# Patient Record
Sex: Male | Born: 1960 | Race: White | Hispanic: No
Health system: Southern US, Community
[De-identification: ages and names within clinical notes are randomized; demographics above are authoritative.]

---

## 2017-01-06 ENCOUNTER — Emergency Department (HOSPITAL_COMMUNITY)
Admission: EM | Admit: 2017-01-06 | Discharge: 2017-01-07 | Disposition: A | Discharge status: Home / Self Care | Payer: Self-pay | Attending: Emergency Medicine | Admitting: Emergency Medicine

## 2017-01-06 DIAGNOSIS — Z79899 Other long term (current) drug therapy: Secondary | ICD-10-CM | POA: Insufficient documentation

## 2017-01-06 DIAGNOSIS — K921 Melena: Secondary | ICD-10-CM | POA: Insufficient documentation

## 2017-01-06 LAB — COMPREHENSIVE METABOLIC PANEL
ALBUMIN: 3.7 g/dL (ref 3.5–5.0)
ALK PHOS: 63 U/L (ref 38–126)
ALT: 15 U/L — ABNORMAL LOW (ref 17–63)
ANION GAP: 7 (ref 5–15)
AST: 17 U/L (ref 15–41)
BILIRUBIN TOTAL: 0.5 mg/dL (ref 0.3–1.2)
BUN: 9 mg/dL (ref 6–20)
CALCIUM: 9 mg/dL (ref 8.9–10.3)
CO2: 23 mmol/L (ref 22–32)
Chloride: 110 mmol/L (ref 101–111)
Creatinine, Ser: 1.04 mg/dL (ref 0.61–1.24)
GFR calc Af Amer: >60 mL/min (ref >60)
GFR calc non Af Amer: >60 mL/min (ref >60)
GLUCOSE: 91 mg/dL (ref 65–99)
Potassium: 3.8 mmol/L (ref 3.5–5.1)
Sodium: 140 mmol/L (ref 135–145)
TOTAL PROTEIN: 6.8 g/dL (ref 6.5–8.1)

## 2017-01-06 LAB — CBC WITH DIFFERENTIAL/PLATELET
Basophils Absolute: 0.1 10*3/uL (ref 0.0–0.1)
Basophils Relative: 1 %
Eosinophils Absolute: 0.1 10*3/uL (ref 0.0–0.7)
Eosinophils Relative: 2 %
HCT: 34.8 % — ABNORMAL LOW (ref 39.0–52.0)
Hemoglobin: 10.6 g/dL — ABNORMAL LOW (ref 13.0–17.0)
Lymphocytes Relative: 31 %
Lymphs Abs: 2.5 10*3/uL (ref 0.7–4.0)
MCH: 21.3 pg — ABNORMAL LOW (ref 26.0–34.0)
MCHC: 30.5 g/dL (ref 30.0–36.0)
MCV: 69.9 fL — ABNORMAL LOW (ref 78.0–100.0)
Monocytes Absolute: 0.5 10*3/uL (ref 0.1–1.0)
Monocytes Relative: 7 %
Neutro Abs: 4.8 10*3/uL (ref 1.7–7.7)
Neutrophils Relative %: 60 %
Platelets: 190 10*3/uL (ref 150–400)
RBC: 4.98 MIL/uL (ref 4.22–5.81)
RDW: 18.5 % — ABNORMAL HIGH (ref 11.5–15.5)
WBC: 8 10*3/uL (ref 4.0–10.5)

## 2017-01-06 LAB — TYPE AND SCREEN
ABO/RH(D): O POS
Antibody Screen: NEGATIVE

## 2017-01-06 LAB — ABO/RH: ABO/RH(D): O POS

## 2017-01-06 LAB — CBC
HEMATOCRIT: 35.9 % — AB (ref 39.0–52.0)
HEMOGLOBIN: 11 g/dL — AB (ref 13.0–17.0)
MCH: 21.1 pg — AB (ref 26.0–34.0)
MCHC: 30.6 g/dL (ref 30.0–36.0)
MCV: 68.9 fL — AB (ref 78.0–100.0)
Platelets: 200 10*3/uL (ref 150–400)
RBC: 5.21 MIL/uL (ref 4.22–5.81)
RDW: 18.2 % — ABNORMAL HIGH (ref 11.5–15.5)
WBC: 8.3 10*3/uL (ref 4.0–10.5)

## 2017-01-06 LAB — POC OCCULT BLOOD, ED: Fecal Occult Bld: POSITIVE — AB

## 2017-01-06 MED ORDER — PANTOPRAZOLE SODIUM 40 MG IV SOLR
40.0000 mg | Freq: Once | INTRAVENOUS | Status: AC
  Administered 2017-01-07: 40 mg via INTRAVENOUS
  Filled 2017-01-06: qty 40

## 2017-01-06 NOTE — ED Provider Notes (Signed)
[facility]-EMERGENCY DEPT Provider Note   CSN: [id number] Arrival date & time: 01/06/17  1723     History   Chief Complaint No chief complaint on file.   HPI Joel Turner is a 56 y.o. male.  The history is provided by the patient. No language interpreter was used.  Rectal Bleeding  Quality:  Black and tarry Amount:  Scant Duration:  1 day Timing:  Rare Chronicity:  Recurrent Context: not hemorrhoids and not rectal pain   Similar prior episodes: yes   Relieved by:  Nothing (Uses daily Nexium) Associated symptoms: abdominal pain and vomiting (x1)   Associated symptoms: no fever, no light-headedness and no loss of consciousness   Abdominal pain:    Location:  Epigastric   Severity:  Mild   Duration:  1 day   Progression:  Waxing and waning   Chronicity:  New Risk factors: no anticoagulant use, no hx of colorectal cancer, no hx of colorectal surgery, no NSAID use and no steroid use     History reviewed. No pertinent past medical history.  There are no active problems to display for this patient.   No past surgical history on file.    Home Medications    Prior to Admission medications   Medication Sig Start Date End Date Taking? Authorizing Provider  esomeprazole (NEXIUM) 40 MG capsule Take 40 mg by mouth daily at 12 noon.   Yes [provider]  ferrous sulfate 325 (65 FE) MG tablet Take 325 mg by mouth daily with breakfast.   Yes [provider]    Family History No family history on file.  Social History Social History  Substance Use Topics  . Smoking status: Not on file  . Smokeless tobacco: Not on file  . Alcohol use Not on file     Allergies   Patient has no known allergies.   Review of Systems Review of Systems  Constitutional: Negative for fever.  Gastrointestinal: Positive for abdominal pain, hematochezia and vomiting (x1).  Neurological: Negative for loss of consciousness and light-headedness.  Ten systems reviewed and are  negative for acute change, except as noted in the HPI.    Physical Exam Updated Vital Signs BP (!) 96/49   Pulse 76   Temp 97.6 F (36.4 C) (Oral)   Resp (!) 21   SpO2 93%   Physical Exam  Constitutional: He is oriented to person, place, and time. He appears well-developed and well-nourished. No distress.  Nontoxic appearing and in NAD  HENT:  Head: Normocephalic and atraumatic.  Moist mm  Eyes: Conjunctivae and EOM are normal. No scleral icterus.  Neck: Normal range of motion.  Cardiovascular: Normal rate, regular rhythm and intact distal pulses.   Pulmonary/Chest: Effort normal. No respiratory distress. He has no wheezes. He has no rales.  Lungs CTAB. Chest expansion symmetric.  Abdominal: Soft. He exhibits no mass. There is tenderness. There is no guarding.  TTP in the epigastric region. Negative Murphy's sign. Moderate distension without peritoneal signs. Normoactive bowel sounds.  Musculoskeletal: Normal range of motion.  Neurological: He is alert and oriented to person, place, and time. He exhibits normal muscle tone. Coordination normal.  GCS 15. Patient moving all extremities.  Skin: Skin is warm and dry. No rash noted. He is not diaphoretic. No erythema. No pallor.  Psychiatric: He has a normal mood and affect. His behavior is normal.  Nursing note and vitals reviewed.    ED Treatments / Results  Labs (all labs ordered are listed, but  only abnormal results are displayed) Labs Reviewed  CBC WITH DIFFERENTIAL/PLATELET - Abnormal; Notable for the following:       Result Value   Hemoglobin 10.6 (*)    HCT 34.8 (*)    MCV 69.9 (*)    MCH 21.3 (*)    RDW 18.5 (*)    All other components within normal limits  COMPREHENSIVE METABOLIC PANEL - Abnormal; Notable for the following:    ALT 15 (*)    All other components within normal limits  CBC - Abnormal; Notable for the following:    Hemoglobin 11.0 (*)    HCT 35.9 (*)    MCV 68.9 (*)    MCH 21.1 (*)    RDW 18.2  (*)    All other components within normal limits  POC OCCULT BLOOD, ED - Abnormal; Notable for the following:    Fecal Occult Bld POSITIVE (*)    All other components within normal limits  TYPE AND SCREEN  ABO/RH    EKG  EKG Interpretation None       Radiology Ct Abdomen Pelvis W Contrast  Result Date: 01/07/2017 CLINICAL DATA:  Acute onset of generalized abdominal pain and hematochezia. Initial encounter. EXAM: CT ABDOMEN AND PELVIS WITH CONTRAST TECHNIQUE: Multidetector CT imaging of the abdomen and pelvis was performed using the standard protocol following bolus administration of intravenous contrast. CONTRAST:  100 mL of Isovue 300 IV contrast COMPARISON:  None. FINDINGS: Lower chest: Trace left-sided pleural fluid is noted. Minimal left basilar scarring is noted. A large hiatal hernia is noted. Trace fluid is seen at the hiatal hernia. Hepatobiliary: The liver is unremarkable in appearance. The gallbladder is unremarkable in appearance. The common bile duct remains normal in caliber. Pancreas: The pancreas is within normal limits. Spleen: The spleen is unremarkable in appearance. Adrenals/Urinary Tract: The adrenal glands are unremarkable in appearance. The kidneys are within normal limits. There is no evidence of hydronephrosis. No renal or ureteral stones are identified. Nonspecific perinephric stranding is noted bilaterally. Stomach/Bowel: The stomach is unremarkable in appearance. The small bowel is within normal limits. The appendix is normal in caliber, without evidence of appendicitis. Scattered diverticulosis is noted along the descending and proximal sigmoid colon, without evidence of diverticulitis. Vascular/Lymphatic: Scattered calcification is seen along the abdominal aorta and its branches. The abdominal aorta is otherwise grossly unremarkable. The inferior vena cava is grossly unremarkable. No retroperitoneal lymphadenopathy is seen. No pelvic sidewall lymphadenopathy is  identified. Reproductive: The bladder is mildly distended and grossly unremarkable. The prostate remains normal in size, with scattered calcification. Other: No additional soft tissue abnormalities are seen. Musculoskeletal: No acute osseous abnormalities are identified. The visualized musculature is unremarkable in appearance. IMPRESSION: 1. No acute abnormality seen within the abdomen or pelvis. 2. Scattered aortic atherosclerosis. 3. Diverticulosis along the descending and proximal sigmoid colon, without evidence of diverticulitis. 4. Large hiatal hernia.  Trace fluid noted at the hiatal hernia. 5. Trace left-sided pleural fluid noted. Electronically Signed   By: Roanna Raider M.D.   On: 01/07/2017 01:25    Procedures Procedures (including critical care time)  Medications Ordered in ED Medications  pantoprazole (PROTONIX) injection 40 mg (40 mg Intravenous Given 01/07/17 0031)  iopamidol (ISOVUE-300) 61 % injection 100 mL (100 mLs Intravenous Contrast Given 01/07/17 0105)     Initial Impression / Assessment and Plan / ED Course  I have reviewed the triage vital signs and the nursing notes.  Pertinent labs & imaging results that were available during my  care of the patient were reviewed by me and considered in my medical decision making (see chart for details).     56 year old male presents to the emergency department for melena. He reports one episode of melena this morning as well as one episode of coffee-ground emesis. He has not had any subsequent bloody emesis, melena, or hematochezia, but does report intermittent epigastric pain. He has been compliant with his daily Nexium. No associated fevers.  Patient with reassuring laboratory workup. He has no leukocytosis. Hemoglobin has been stable over 8 hours.no elevated BUN to suggest acute or emergent blood loss. No elevated BUN. CT abdomen pelvis today is reassuring as well. Patient hemodynamically stable. No tachycardia.  While patient  reports a history of GI bleed requiring transfusion, this does not appear to be the case today. The patient has been advised to follow-up with gastroenterology, though he declines referral stating that he will not follow up with a specialist. I have advised that he discontinue use of any NSAIDs and to continue his Nexium. Bleeding and return precautions discussed and provided. Patient discharged in stable condition with no unaddressed concerns.   Final Clinical Impressions(s) / ED Diagnoses   Final diagnoses:  Melena    New Prescriptions New Prescriptions   No medications on file     Darylene PriceHumes, Coy Rochford, PA-C 01/15/17 [id number]    Tilden Fossaees, Elizabeth, MD 01/22/17 [phone]

## 2017-01-07 ENCOUNTER — Encounter (HOSPITAL_COMMUNITY): Payer: Self-pay

## 2017-01-07 ENCOUNTER — Emergency Department (HOSPITAL_COMMUNITY): Payer: Self-pay

## 2017-01-07 MED ORDER — IOPAMIDOL (ISOVUE-300) INJECTION 61%
INTRAVENOUS | Status: AC
  Administered 2017-01-07: 100 mL via INTRAVENOUS
  Filled 2017-01-07: qty 100

## 2017-01-07 MED ORDER — IOPAMIDOL (ISOVUE-370) INJECTION 76%: 100.0000 mL | Freq: Once | INTRAVENOUS | Status: DC | PRN

## 2017-01-07 MED ORDER — IOPAMIDOL (ISOVUE-300) INJECTION 61%
100.0000 mL | Freq: Once | INTRAVENOUS | Status: AC | PRN
  Administered 2017-01-07: 100 mL via INTRAVENOUS

## 2017-01-07 NOTE — Discharge Instructions (Signed)
Continue taking your Nexium as prescribed. Avoid anti-inflammatory medications such as ibuprofen, Aleve, or naproxen. We also recommend that you avoid foods that may worsen gastroesophageal reflux. Given your history of GI bleed requiring transfusion, we strongly advise that you seek follow-up with a gastroenterologist for continued care. You may return to the emergency department as needed for new or concerning symptoms.

## 2018-05-27 IMAGING — CT CT ABD-PELV W/ CM
2 of 5 series · 16 of 46 positions shown, 18 images · IV contrast (ISOVUE)
Comparison: None.

CLINICAL DATA: Acute onset of generalized abdominal pain and
hematochezia. Initial encounter.

EXAM:
CT ABDOMEN AND PELVIS WITH CONTRAST
TECHNIQUE: Multidetector CT imaging of the abdomen and pelvis was performed
using the standard protocol following bolus administration of
intravenous contrast.
CONTRAST:  100 mL of Isovue 300 IV contrast

[Series 2: abd/pel with · axial · 0.88mm/px · z∈[+1116,+1521]mm · 13 of 93 slices shown, 15 images]
[im 6/93  soft-tissue]
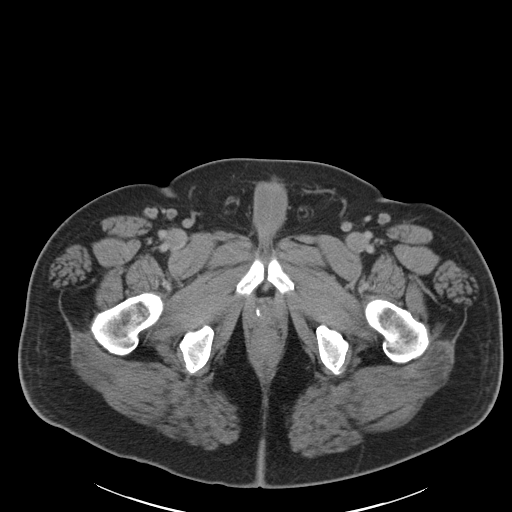
[im 6/93  bone]
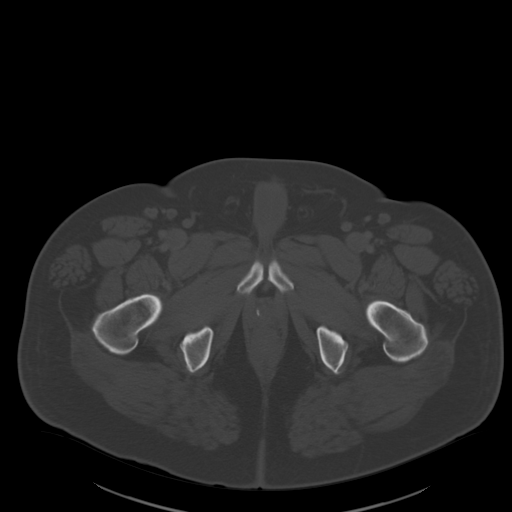
[im 12/93  soft-tissue]
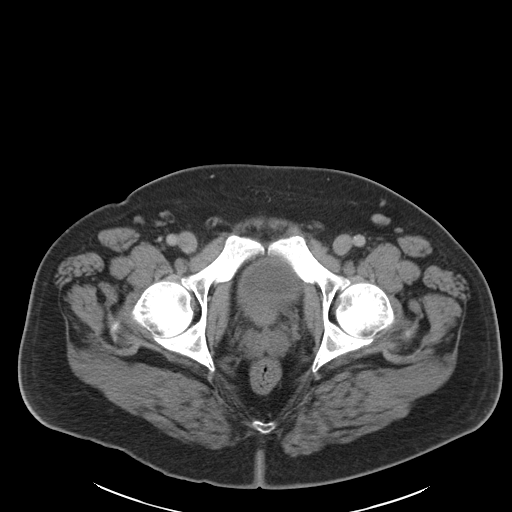
[im 18/93  soft-tissue]
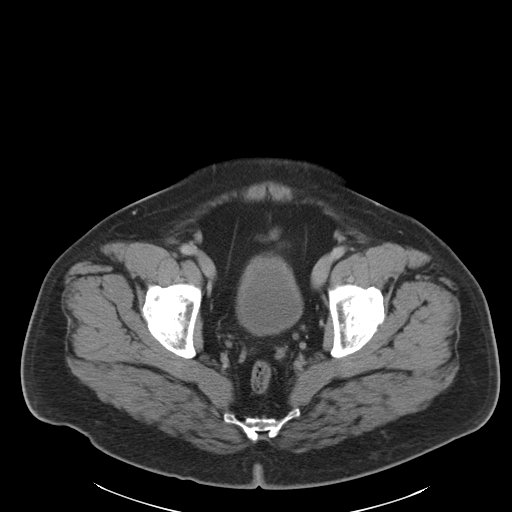
[im 29/93  soft-tissue]
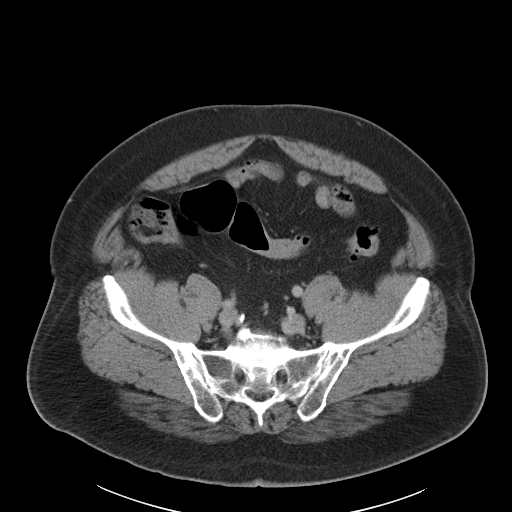
[im 35/93  soft-tissue]
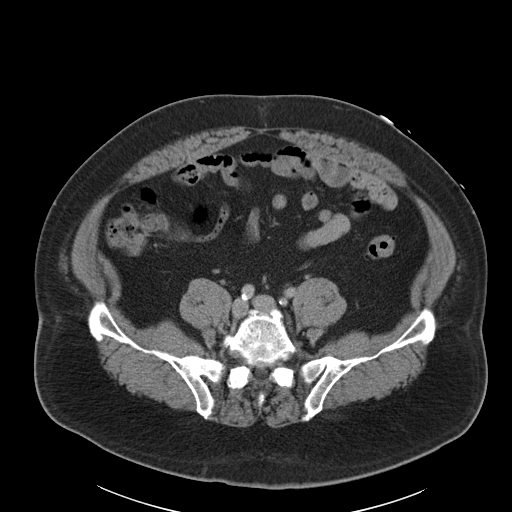
[im 41/93  soft-tissue]
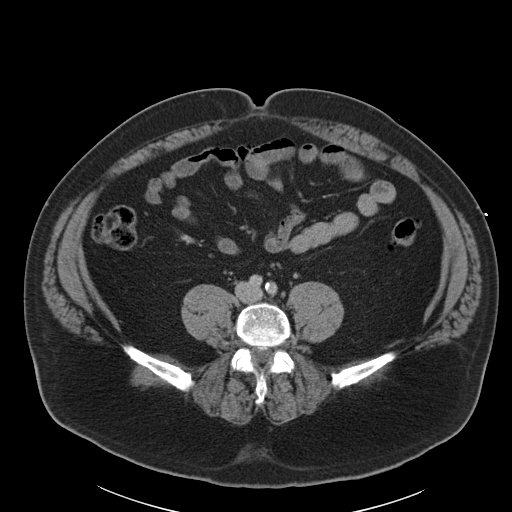
[im 47/93  soft-tissue]
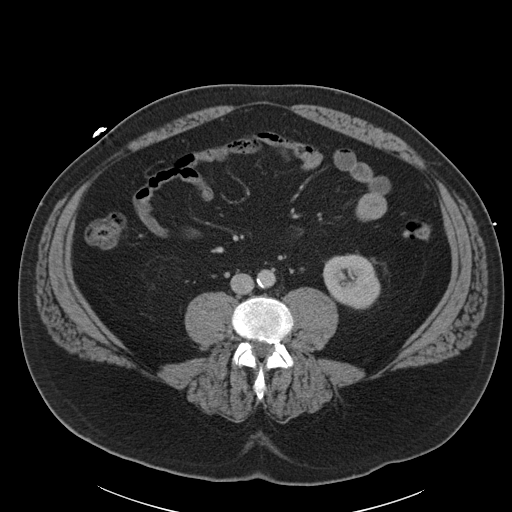
[im 52/93  soft-tissue]
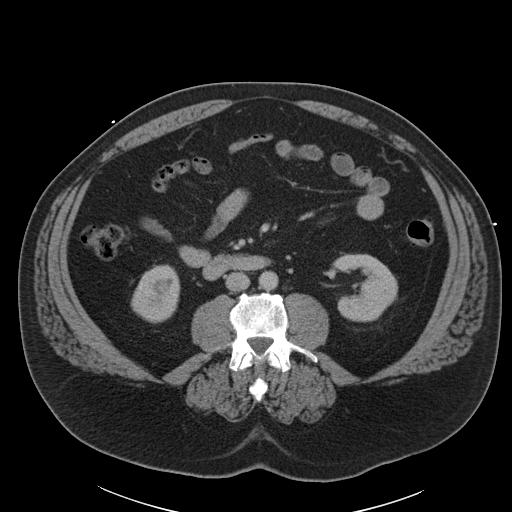
[im 58/93  soft-tissue]
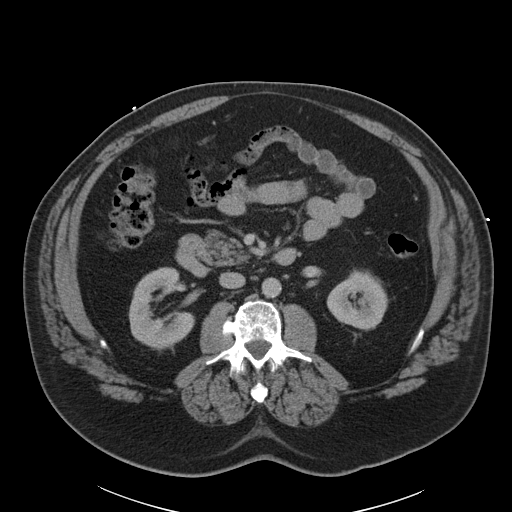
[im 58/93  bone]
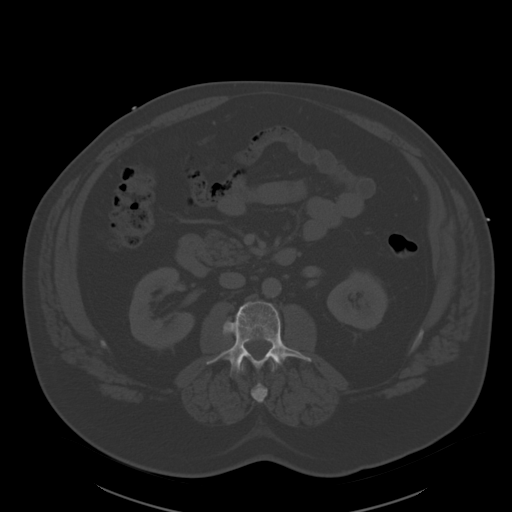
[im 64/93  soft-tissue]
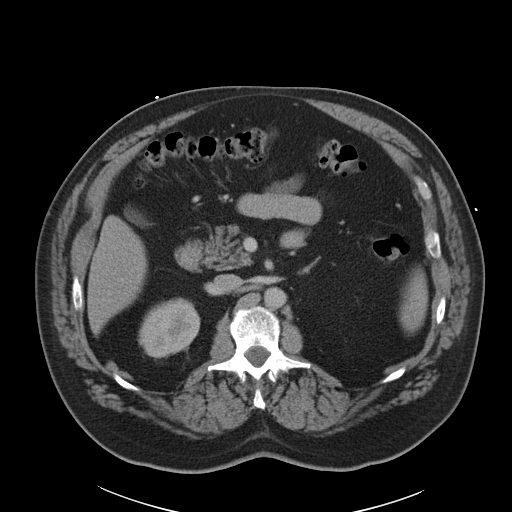
[im 75/93  soft-tissue]
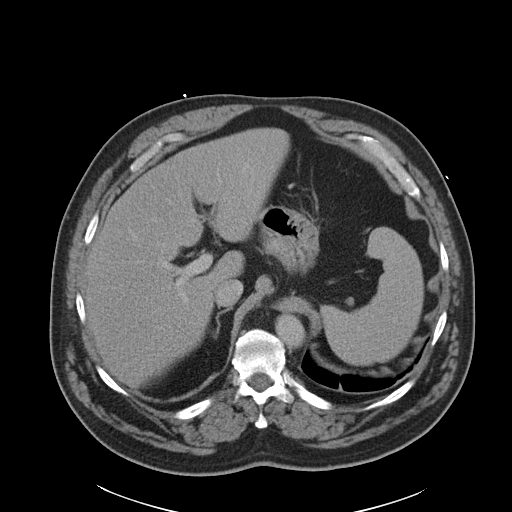
[im 81/93  soft-tissue]
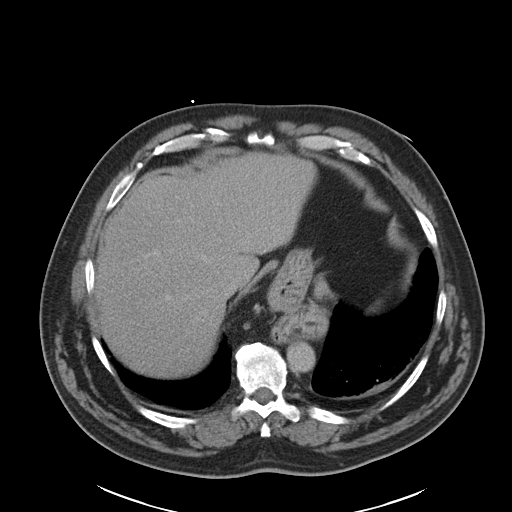
[im 87/93  soft-tissue]
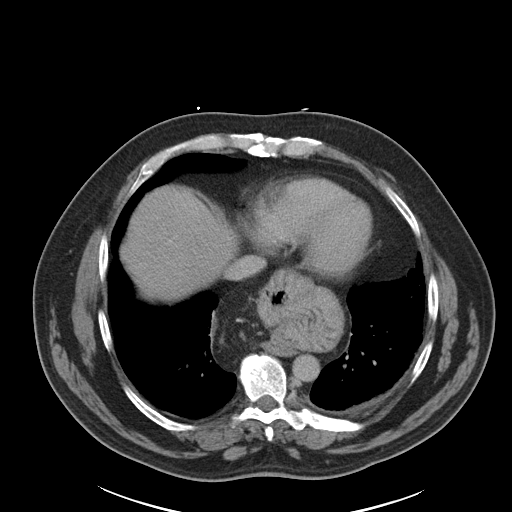

[Series 4: coronal a/|p · coronal · 0.78mm/px · 3 of 168 slices shown]
[im 56/168  soft-tissue]
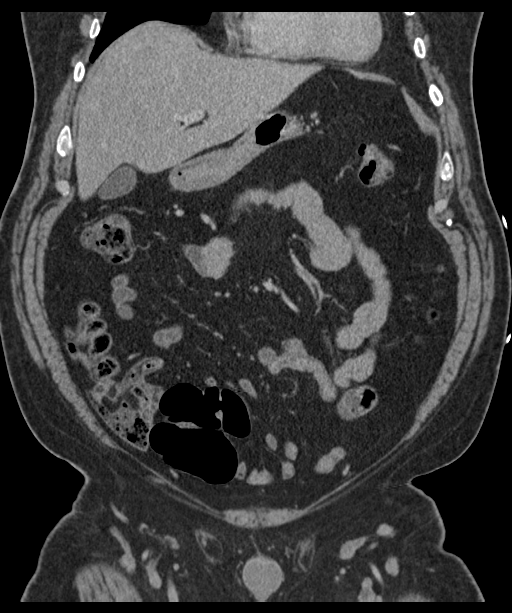
[im 75/168  soft-tissue]
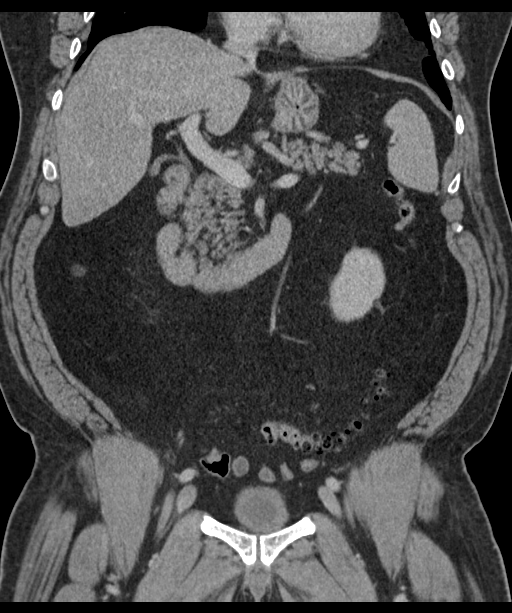
[im 93/168  soft-tissue]
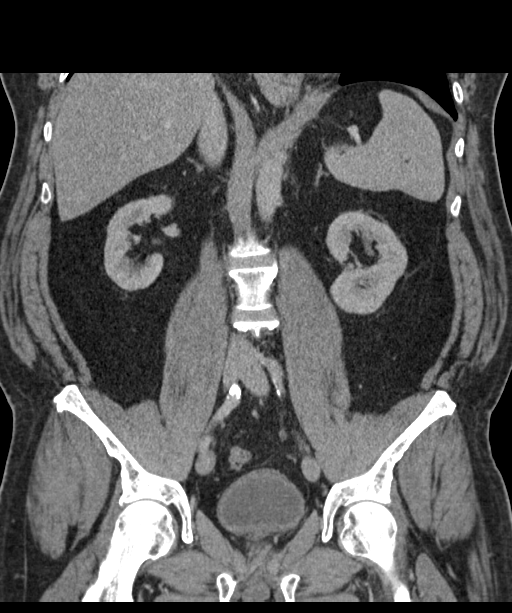

[16 of 46 positions shown; findings below may reference images not displayed]

FINDINGS: Lower chest: Trace left-sided pleural fluid is noted. Minimal left
basilar scarring is noted. A large hiatal hernia is noted. Trace
fluid is seen at the hiatal hernia.

Hepatobiliary: The liver is unremarkable in appearance. The
gallbladder is unremarkable in appearance. The common bile duct
remains normal in caliber.

Pancreas: The pancreas is within normal limits.

Spleen: The spleen is unremarkable in appearance.

Adrenals/Urinary Tract: The adrenal glands are unremarkable in
appearance. The kidneys are within normal limits. There is no
evidence of hydronephrosis. No renal or ureteral stones are
identified. Nonspecific perinephric stranding is noted bilaterally.

Stomach/Bowel: The stomach is unremarkable in appearance. The small
bowel is within normal limits. The appendix is normal in caliber,
without evidence of appendicitis.

Scattered diverticulosis is noted along the descending and proximal
sigmoid colon, without evidence of diverticulitis.

Vascular/Lymphatic: Scattered calcification is seen along the
abdominal aorta and its branches. The abdominal aorta is otherwise
grossly unremarkable. The inferior vena cava is grossly
unremarkable. No retroperitoneal lymphadenopathy is seen. No pelvic
sidewall lymphadenopathy is identified.

Reproductive: The bladder is mildly distended and grossly
unremarkable. The prostate remains normal in size, with scattered
calcification.

Other: No additional soft tissue abnormalities are seen.

Musculoskeletal: No acute osseous abnormalities are identified. The
visualized musculature is unremarkable in appearance.
IMPRESSION: 1. No acute abnormality seen within the abdomen or pelvis.
2. Scattered aortic atherosclerosis.
3. Diverticulosis along the descending and proximal sigmoid colon,
without evidence of diverticulitis.
4. Large hiatal hernia.  Trace fluid noted at the hiatal hernia.
5. Trace left-sided pleural fluid noted.
